# Patient Record
Sex: Female | Born: 2000 | Race: White | Hispanic: No | Marital: Single | Smoking: Current some day smoker
Health system: Southern US, Community
[De-identification: ages and names within clinical notes are randomized; demographics above are authoritative.]

## PROBLEM LIST (undated history)

## (undated) DIAGNOSIS — F419 Anxiety disorder, unspecified: Secondary | ICD-10-CM

## (undated) DIAGNOSIS — G43909 Migraine, unspecified, not intractable, without status migrainosus: Secondary | ICD-10-CM

## (undated) DIAGNOSIS — D649 Anemia, unspecified: Secondary | ICD-10-CM

---
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
## 2018-02-04 ENCOUNTER — Encounter (HOSPITAL_COMMUNITY): Payer: Self-pay

## 2018-02-04 ENCOUNTER — Other Ambulatory Visit: Payer: Self-pay

## 2018-02-04 ENCOUNTER — Ambulatory Visit (HOSPITAL_COMMUNITY)
Admission: EM | Admit: 2018-02-04 | Discharge: 2018-02-04 | Disposition: A | Discharge status: Home / Self Care | Payer: Medicaid Other | Attending: Family Medicine | Admitting: Family Medicine

## 2018-02-04 DIAGNOSIS — G44209 Tension-type headache, unspecified, not intractable: Secondary | ICD-10-CM

## 2018-02-04 DIAGNOSIS — F411 Generalized anxiety disorder: Secondary | ICD-10-CM

## 2018-02-04 DIAGNOSIS — R079 Chest pain, unspecified: Secondary | ICD-10-CM

## 2018-02-04 MED ORDER — KETOROLAC TROMETHAMINE 30 MG/ML IJ SOLN
30.0000 mg | Freq: Once | INTRAMUSCULAR | Status: AC
  Administered 2018-02-04: 30 mg via INTRAMUSCULAR

## 2018-02-04 MED ORDER — KETOROLAC TROMETHAMINE 30 MG/ML IJ SOLN
INTRAMUSCULAR | Status: AC
  Filled 2018-02-04: qty 1

## 2018-02-04 MED ORDER — HYDROXYZINE HCL 25 MG PO TABS: 25.0000 mg | ORAL_TABLET | Freq: Three times a day (TID) | ORAL | 0 refills | Status: DC | PRN

## 2018-02-04 NOTE — Discharge Instructions (Addendum)
I believe that most of your symptoms are related to stress and anxiety. We will give you a Toradol injection here in the clinic for your pain I am sending a prescription for hydroxyzine to the pharmacy to help with anxiety It looks like you were prescribed naproxen at your last visit You can take this for further chest pain and headaches Follow-up with your psychiatrist for further management

## 2018-02-04 NOTE — ED Triage Notes (Signed)
Pt cc headachesx 1 month and chest pain due to acid reflux x 3 months.

## 2018-02-06 ENCOUNTER — Other Ambulatory Visit: Payer: Self-pay

## 2018-02-06 ENCOUNTER — Emergency Department (HOSPITAL_COMMUNITY): Payer: Medicaid Other

## 2018-02-06 ENCOUNTER — Emergency Department (HOSPITAL_COMMUNITY)
Admission: EM | Admit: 2018-02-06 | Discharge: 2018-02-06 | Disposition: A | Discharge status: Home / Self Care | Payer: Medicaid Other | Attending: Emergency Medicine | Admitting: Emergency Medicine

## 2018-02-06 ENCOUNTER — Encounter (HOSPITAL_COMMUNITY): Payer: Self-pay | Admitting: Emergency Medicine

## 2018-02-06 DIAGNOSIS — R51 Headache: Secondary | ICD-10-CM | POA: Diagnosis present

## 2018-02-06 DIAGNOSIS — G43009 Migraine without aura, not intractable, without status migrainosus: Secondary | ICD-10-CM

## 2018-02-06 DIAGNOSIS — G43909 Migraine, unspecified, not intractable, without status migrainosus: Secondary | ICD-10-CM | POA: Insufficient documentation

## 2018-02-06 HISTORY — DX: Anxiety disorder, unspecified: F41.9

## 2018-02-06 HISTORY — DX: Migraine, unspecified, not intractable, without status migrainosus: G43.909

## 2018-02-06 MED ORDER — SODIUM CHLORIDE 0.9 % IV BOLUS
1000.0000 mL | Freq: Once | INTRAVENOUS | Status: AC
  Administered 2018-02-06: 1000 mL via INTRAVENOUS

## 2018-02-06 MED ORDER — PROCHLORPERAZINE EDISYLATE 10 MG/2ML IJ SOLN
10.0000 mg | Freq: Once | INTRAMUSCULAR | Status: AC
  Administered 2018-02-06: 10 mg via INTRAVENOUS
  Filled 2018-02-06: qty 2

## 2018-02-06 MED ORDER — DIPHENHYDRAMINE HCL 50 MG/ML IJ SOLN
25.0000 mg | Freq: Once | INTRAMUSCULAR | Status: AC
  Administered 2018-02-06: 25 mg via INTRAVENOUS
  Filled 2018-02-06: qty 1

## 2018-02-06 MED ORDER — PROCHLORPERAZINE MALEATE 10 MG PO TABS: 10.0000 mg | ORAL_TABLET | Freq: Two times a day (BID) | ORAL | 0 refills | Status: DC | PRN

## 2018-02-06 NOTE — ED Triage Notes (Signed)
Pt c/o headache that started tonight, also reports facial numbness and states she feels like her heart is racing. Hx migraines and anxiety.

## 2018-02-06 NOTE — ED Provider Notes (Signed)
[facility] Provider Note MRN:  [id number]  Arrival date & time: 02/06/18     Chief Complaint   Headache   History of Present Illness   Sydney BernHaleigh N Bialecki is a 17 y.o. year-old female with a history of anxiety presenting to the ED with chief complaint of headache.  Patient explains that prior to 1 month ago, she rarely has headaches.  Experiencing intermittent moderate to severe headaches.  2 hours prior to arrival, she was in the car when she experienced severe frontal head pain and pressure, which became severe and at its worst after only 10 minutes.  Associated with paresthesias to bilateral hands, denies any numbness or weakness.  Worse with bright lights.  Also endorsing intermittent chest pain for several weeks, none presently.  Review of Systems  A complete 10 system review of systems was obtained and all systems are negative except as noted in the HPI and PMH.   Patient's Health History    Past Medical History:  Diagnosis Date  . Anemia   . Anxiety   . Migraines     History reviewed. No pertinent surgical history.  No family history on file.  Social History   Socioeconomic History  . Marital status: Single    Spouse name: Not on file  . Number of children: Not on file  . Years of education: Not on file  . Highest education level: Not on file  Occupational History  . Not on file  Social Needs  . Financial resource strain: Not on file  . Food insecurity:    Worry: Not on file    Inability: Not on file  . Transportation needs:    Medical: Not on file    Non-medical: Not on file  Tobacco Use  . Smoking status: Current Some Day Smoker    Types: E-cigarettes  . Smokeless tobacco: Never Used  . Tobacco comment: vape  Substance and Sexual Activity  . Alcohol use: Yes  . Drug use: Not on file  . Sexual activity: Not on file  Lifestyle  . Physical activity:    Days per week: Not on file    Minutes per session: Not on file    . Stress: Not on file  Relationships  . Social connections:    Talks on phone: Not on file    Gets together: Not on file    Attends religious service: Not on file    Active member of club or organization: Not on file    Attends meetings of clubs or organizations: Not on file    Relationship status: Not on file  . Intimate partner violence:    Fear of current or ex partner: Not on file    Emotionally abused: Not on file    Physically abused: Not on file    Forced sexual activity: Not on file  Other Topics Concern  . Not on file  Social History Narrative  . Not on file     Physical Exam  Vital Signs and Nursing Notes reviewed Vitals:   02/06/18 2000 02/06/18 2230  BP: 115/82 101/68  Pulse: 72 91  Resp: 15 16  Temp:    SpO2: 99% 100%    CONSTITUTIONAL: Well-appearing, NAD NEURO:  Alert and oriented x 3, no focal deficits EYES:  eyes equal and reactive ENT/NECK:  no LAD, no JVD CARDIO: Regular rate, well-perfused, normal S1 and S2 PULM:  CTAB no wheezing or rhonchi GI/GU:  normal bowel sounds, non-distended, non-tender  MSK/SPINE:  No gross deformities, no edema SKIN:  no rash, atraumatic PSYCH:  Appropriate speech and behavior  Diagnostic and Interventional Summary    EKG Interpretation  Date/Time:  Saturday February 06 2018 20:52:35 EST Ventricular Rate:  81 PR Interval:    QRS Duration: 103 QT Interval:  395 QTC Calculation: 459 R Axis:   90 Text Interpretation:  Sinus rhythm Borderline right axis deviation Confirmed by Kennis CarinaBero,  [phone]) on 02/06/2018 10:04:48 PM      Labs Reviewed - No data to display  CT HEAD WO CONTRAST  Final Result      Medications  prochlorperazine (COMPAZINE) injection 10 mg (10 mg Intravenous Given 02/06/18 2105)  diphenhydrAMINE (BENADRYL) injection 25 mg (25 mg Intravenous Given 02/06/18 2105)  sodium chloride 0.9 % bolus 1,000 mL (0 mLs Intravenous Stopped 02/06/18 2214)     Procedures Critical Care  ED Course and  Medical Decision Making  I have reviewed the triage vital signs and the nursing notes.  Pertinent labs & imaging results that were available during my care of the patient were reviewed by me and considered in my medical decision making (see below for details).  No fever, no meningismus, no neurological deficits, all seems consistent with migraine headache with the exception of sudden onset nature.  Will CT head to exclude the possibility of subarachnoid hemorrhage.  Chest pain is intermittent, sharp, mild, has been worked up twice in the past, none currently present, EKG reassuring here.  CT head unremarkable, patient feeling better after migraine cocktail, advised PCP follow-up.  After the discussed management above, the patient was determined to be safe for discharge.  The patient was in agreement with this plan and all questions regarding their care were answered.  ED return precautions were discussed and the patient will return to the ED with any significant worsening of condition.  Elmer Sow M. Pilar PlateBero, MD [facility] [email or web address]  Final Clinical Impressions(s) / ED Diagnoses     ICD-10-CM   1. Migraine without aura and without status migrainosus, not intractable G43.009     ED Discharge Orders         Ordered    prochlorperazine (COMPAZINE) 10 MG tablet  2 times daily PRN     02/06/18 2240             Sabas SousBero,  M, MD 02/06/18 2249

## 2018-02-06 NOTE — Discharge Instructions (Addendum)
You were evaluated in the Emergency Department and after careful evaluation, we did not find any emergent condition requiring admission or further testing in the hospital.  Your symptoms today seem to be due to a migraine headache.  Please discuss your headaches with a primary care provider and discuss possible preventative medications.  I have provided you with a prescription for Compazine, which is helpful for migraines.  Do not use this medication if there is a chance that you are pregnant.  Please return to the Emergency Department if you experience any worsening of your condition.  We encourage you to follow up with a primary care provider.  Thank you for allowing us to be a part of your care.

## 2018-02-08 NOTE — ED Provider Notes (Signed)
[facility]    CSN: [id number] Arrival date & time: 02/04/18  1843     History   Chief Complaint Chief Complaint  Patient presents with  . Headache    HPI Sydney Chan is a 17 y.o. female.   Pt is a 17 year old female with PMH of anemia, anxiety, migraines that presents with headaches and chest pain. The headaches have been intermittent over the past month.  She describes them as dull ache in the temple areas mostly on the left side.  She has some mild photophobia with this but denies any visual changes, dizziness, nausea, vomiting.  She is not currently taking any prescribed medication to treat the migraines.  Typically she uses over-the-counter medication which somewhat helps.  She admits to being under a lot of stress and having some anxiety recently.  She is currently seeing a psychiatrist but has not been prescribed any medication for anxiety. She is also here for intermittent chest pain that is centralized.  This pain waxes and wanes but is worse with certain movements and deep breathing.  She denies any associated shortness of breath but does admit to using vape.  Denies any cough, congestion, fever, body aches, night sweats.  She was seen here on 11/17/2017 and treated with naproxen for possibility of musculoskeletal pain and omeprazole for GERD.  She admits to being scared to take any medication that was prescribed and having a lot of anxiety about it.  ROS per HPI    Headache    Past Medical History:  Diagnosis Date  . Anemia   . Anxiety   . Migraines     There are no active problems to display for this patient.   History reviewed. No pertinent surgical history.  OB History   No obstetric history on file.      Home Medications    Prior to Admission medications   Medication Sig Start Date End Date Taking? Authorizing Provider  Acetaminophen-Caff-Pyrilamine (MIDOL COMPLETE PO) Take 2 tablets by mouth daily as needed (menstrual pain).     [provider]  hydrOXYzine (ATARAX/VISTARIL) 25 MG tablet Take 1 tablet (25 mg total) by mouth every 8 (eight) hours as needed. Patient taking differently: Take 25 mg by mouth every 8 (eight) hours as needed for anxiety.  02/04/18   Dahlia ByesBast, Sarha Bartelt A, NP  ibuprofen (ADVIL,MOTRIN) 200 MG tablet Take 400 mg by mouth daily as needed for headache (pain).    [provider]  naproxen (NAPROSYN) 375 MG tablet Take 1 tablet (375 mg total) by mouth 2 (two) times daily. Patient not taking: Reported on 02/06/2018 11/17/17   Wieters, Hallie C, PA-C  omeprazole (PRILOSEC) 20 MG capsule Take 1 capsule (20 mg total) by mouth daily for 14 days. Patient not taking: Reported on 02/06/2018 11/17/17 12/01/17  Wieters, Hallie C, PA-C  prochlorperazine (COMPAZINE) 10 MG tablet Take 1 tablet (10 mg total) by mouth 2 (two) times daily as needed for nausea. 02/06/18   Sabas SousBero, Michael M, MD    Family History History reviewed. No pertinent family history.  Social History Social History   Tobacco Use  . Smoking status: Current Some Day Smoker    Types: E-cigarettes  . Smokeless tobacco: Never Used  . Tobacco comment: vape  Substance Use Topics  . Alcohol use: Yes  . Drug use: Not on file     Allergies   Patient has no known allergies.   Review of Systems Review of Systems  Neurological: Positive  for headaches.     Physical Exam Triage Vital Signs ED Triage Vitals  Enc Vitals Group     BP 02/04/18 1929 (!) 119/98     Pulse Rate 02/04/18 1929 99     Resp 02/04/18 1929 18     Temp 02/04/18 1929 98.2 F (36.8 C)     Temp Source 02/04/18 1929 Oral     SpO2 02/04/18 1929 100 %     Weight 02/04/18 1932 135 lb (61.2 kg)     Height --      Head Circumference --      Peak Flow --      Pain Score 02/04/18 1931 6     Pain Loc --      Pain Edu? --      Excl. in GC? --    No data found.  Updated Vital Signs BP (!) 119/98 (BP Location: Right Arm)   Pulse 99   Temp 98.2 F (36.8 C)  (Oral)   Resp 18   Wt 135 lb (61.2 kg)   LMP 01/14/2018   SpO2 100%   BMI 20.53 kg/m   Visual Acuity Right Eye Distance:   Left Eye Distance:   Bilateral Distance:    Right Eye Near:   Left Eye Near:    Bilateral Near:     Physical Exam Vitals signs and nursing note reviewed.  Constitutional:      General: She is not in acute distress.    Appearance: She is well-developed. She is not ill-appearing or toxic-appearing.  HENT:     Head: Normocephalic and atraumatic.     Mouth/Throat:     Mouth: Mucous membranes are moist.     Pharynx: Oropharynx is clear.  Eyes:     General: No visual field deficit.    Extraocular Movements: Extraocular movements intact.     Right eye: No nystagmus.     Left eye: No nystagmus.     Pupils: Pupils are equal, round, and reactive to light.  Neck:     Musculoskeletal: Normal range of motion.  Cardiovascular:     Rate and Rhythm: Normal rate and regular rhythm.     Heart sounds: Normal heart sounds.  Pulmonary:     Effort: Pulmonary effort is normal.     Breath sounds: Normal breath sounds.  Abdominal:     General: Bowel sounds are normal. There is no distension.     Palpations: Abdomen is soft. There is no mass.     Tenderness: There is no abdominal tenderness. There is no guarding.  Musculoskeletal: Normal range of motion.  Skin:    General: Skin is warm and dry.     Findings: Rash present.  Neurological:     Mental Status: She is alert and oriented to person, place, and time.     GCS: GCS eye subscore is 4. GCS verbal subscore is 5. GCS motor subscore is 6.     Cranial Nerves: No cranial nerve deficit, dysarthria or facial asymmetry.     Sensory: No sensory deficit.     Motor: No weakness.     Coordination: Romberg sign negative. Coordination normal.     Gait: Gait normal.     Comments: No focal neuro deficits   Psychiatric:        Mood and Affect: Mood is anxious.      UC Treatments / Results  Labs (all labs ordered are  listed, but only abnormal results are displayed) Labs Reviewed - No  data to display  EKG None  Radiology Ct Head Wo Contrast  Result Date: 02/06/2018 CLINICAL DATA:  Headache. EXAM: CT HEAD WITHOUT CONTRAST TECHNIQUE: Contiguous axial images were obtained from the base of the skull through the vertex without intravenous contrast. COMPARISON:  No comparison studies available. FINDINGS: Brain: There is no evidence for acute hemorrhage, hydrocephalus, mass lesion, or abnormal extra-axial fluid collection. No definite CT evidence for acute infarction. Vascular: No hyperdense vessel or unexpected calcification. Skull: No evidence for fracture. No worrisome lytic or sclerotic lesion. Sinuses/Orbits: The visualized paranasal sinuses and mastoid air cells are clear. Visualized portions of the globes and intraorbital fat are unremarkable. Other: None. IMPRESSION: Normal CT evaluation of the brain. Electronically Signed   By: Kennith Center M.D.   On: 02/06/2018 21:11    Procedures Procedures (including critical care time)  Medications Ordered in UC Medications  ketorolac (TORADOL) 30 MG/ML injection 30 mg (30 mg Intramuscular Given 02/04/18 2002)    Initial Impression / Assessment and Plan / UC Course  I have reviewed the triage vital signs and the nursing notes.  Pertinent labs & imaging results that were available during my care of the patient were reviewed by me and considered in my medical decision making (see chart for details).     Patient is a 17 year old female that presents today with chronic headaches and chest discomfort She was just seen for the chest discomfort on 11/17/2017 and was found to have negative chest x-ray and negative EKG. she was then prescribed omeprazole and given a GI cocktail in clinic.  She was also prescribed naproxen for possibility of musculoskeletal chest pain She returns today still having intermittent chest pains and headache that appears to be stress and  tension related I believe most of her chest pain is likely due to anxiety which brings on her headaches Toradol injection given here in the clinic for headache.  This will also cover if she is having any musculoskeletal chest pain. Will send home with prescription for hydroxyzine for anxiety and have her take this as needed Instructed to follow back up with her psychiatrist for daily management of her anxiety I believe that once her anxiety is controlled that her symptoms should somewhat resolve Instructed to discontinue using the vape due to its harmful effects She can continue the naproxen as needed for pain and the omeprazole for acid reflux Patient understanding and agreeable to plan  Final Clinical Impressions(s) / UC Diagnoses   Final diagnoses:  Acute non intractable tension-type headache  Chest pain, unspecified type  Generalized anxiety disorder     Discharge Instructions     I believe that most of your symptoms are related to stress and anxiety. We will give you a Toradol injection here in the clinic for your pain I am sending a prescription for hydroxyzine to the pharmacy to help with anxiety It looks like you were prescribed naproxen at your last visit You can take this for further chest pain and headaches Follow-up with your psychiatrist for further management     ED Prescriptions    Medication Sig Dispense Auth. Provider   hydrOXYzine (ATARAX/VISTARIL) 25 MG tablet Take 1 tablet (25 mg total) by mouth every 8 (eight) hours as needed. Patient taking differently:  Take 25 mg by mouth every 8 (eight) hours as needed for anxiety.  21 tablet Janace Aris, NP     Controlled Substance Prescriptions [place] Controlled Substance Registry consulted? no   Janace Aris, NP 02/08/18 [phone]

## 2018-03-07 ENCOUNTER — Emergency Department (HOSPITAL_COMMUNITY)
Admission: EM | Admit: 2018-03-07 | Discharge: 2018-03-07 | Disposition: A | Discharge status: Home / Self Care | Payer: Medicaid Other | Attending: Emergency Medicine | Admitting: Emergency Medicine

## 2018-03-07 ENCOUNTER — Other Ambulatory Visit: Payer: Self-pay

## 2018-03-07 ENCOUNTER — Encounter (HOSPITAL_COMMUNITY): Payer: Self-pay | Admitting: Emergency Medicine

## 2018-03-07 DIAGNOSIS — G43109 Migraine with aura, not intractable, without status migrainosus: Secondary | ICD-10-CM | POA: Diagnosis not present

## 2018-03-07 DIAGNOSIS — H5711 Ocular pain, right eye: Secondary | ICD-10-CM | POA: Diagnosis present

## 2018-03-07 DIAGNOSIS — F17290 Nicotine dependence, other tobacco product, uncomplicated: Secondary | ICD-10-CM | POA: Diagnosis not present

## 2018-03-07 MED ORDER — METOCLOPRAMIDE HCL 5 MG/ML IJ SOLN
10.0000 mg | INTRAMUSCULAR | Status: DC
  Filled 2018-03-07: qty 2

## 2018-03-07 MED ORDER — TETRACAINE HCL 0.5 % OP SOLN
1.0000 [drp] | Freq: Once | OPHTHALMIC | Status: AC
  Administered 2018-03-07: 1 [drp] via OPHTHALMIC
  Filled 2018-03-07: qty 4

## 2018-03-07 MED ORDER — SODIUM CHLORIDE 0.9 % IV BOLUS: 500.0000 mL | Freq: Once | INTRAVENOUS | Status: DC

## 2018-03-07 NOTE — ED Notes (Signed)
Reviewed d/c instructions with pt, who verbalized understanding and had no outstanding questions. Pt departed in NAD, refused use of wheelchair.   

## 2018-03-07 NOTE — Discharge Instructions (Signed)
We recommend that you continue Fioricet as previously prescribed by your primary care doctor.  Follow-up with your primary doctor regarding your visit to the ED today.  You may benefit from future follow-up with a neurologist given your frequent headaches.  You may return for new or concerning symptoms.

## 2018-03-07 NOTE — ED Triage Notes (Addendum)
Pt reports daily migraines for the past 2 weeks. Today, reports new onset of aching pain to R eye, with reported "cloudy" vision change to same.

## 2018-03-07 NOTE — ED Provider Notes (Signed)
[facility] Provider Note   CSN: [id number] Arrival date & time: 03/07/18  0204     History   Chief Complaint Chief Complaint  Patient presents with  . Migraine  . Eye Pain    HPI Sydney Chan is a 18 y.o. female.  18 year old female with a history of migraines presents to the emergency department for evaluation of periorbital pain on the right.  Patient states that she has been having daily migraines for the past 2 weeks.  Did see a primary care doctor recently who started her on Fioricet to take as needed.  She has not tried this medication yet.  Been noticing fairly consistent pain around her right eye today, unchanged.  Describes the pain as aching.  She has had some blurry vision in the right eye, but states that this resolves when she wears her glasses.  She has not had any facial injury or head trauma.  No complete vision loss, periorbital redness or swelling, fevers, vomiting, extremity numbness or paresthesias, extremity weakness.  The history is provided by the patient. No language interpreter was used.  Migraine   Eye Pain     Past Medical History:  Diagnosis Date  . Anemia   . Anxiety   . Migraines     There are no active problems to display for this patient.   History reviewed. No pertinent surgical history.   OB History   No obstetric history on file.      Home Medications    Prior to Admission medications   Medication Sig Start Date End Date Taking? Authorizing Provider  Acetaminophen-Caff-Pyrilamine (MIDOL COMPLETE PO) Take 2 tablets by mouth daily as needed (menstrual pain).    [provider]  hydrOXYzine (ATARAX/VISTARIL) 25 MG tablet Take 1 tablet (25 mg total) by mouth every 8 (eight) hours as needed. Patient taking differently: Take 25 mg by mouth every 8 (eight) hours as needed for anxiety.  02/04/18   Dahlia ByesBast, Traci A, NP  ibuprofen (ADVIL,MOTRIN) 200 MG tablet Take 400 mg by mouth daily as  needed for headache (pain).    [provider]  naproxen (NAPROSYN) 375 MG tablet Take 1 tablet (375 mg total) by mouth 2 (two) times daily. Patient not taking: Reported on 02/06/2018 11/17/17   Wieters, Hallie C, PA-C  omeprazole (PRILOSEC) 20 MG capsule Take 1 capsule (20 mg total) by mouth daily for 14 days. Patient not taking: Reported on 02/06/2018 11/17/17 12/01/17  Wieters, Hallie C, PA-C  prochlorperazine (COMPAZINE) 10 MG tablet Take 1 tablet (10 mg total) by mouth 2 (two) times daily as needed for nausea. 02/06/18   Sabas SousBero, Michael M, MD    Family History History reviewed. No pertinent family history.  Social History Social History   Tobacco Use  . Smoking status: Current Some Day Smoker    Types: E-cigarettes  . Smokeless tobacco: Never Used  . Tobacco comment: vape  Substance Use Topics  . Alcohol use: Yes  . Drug use: Not on file     Allergies   Compazine [prochlorperazine edisylate]   Review of Systems Review of Systems  Eyes: Positive for pain.  Ten systems reviewed and are negative for acute change, except as noted in the HPI.    Physical Exam Updated Vital Signs BP 108/80   Pulse 81   Temp 97.8 F (36.6 C) (Oral)   Resp 14   Ht 5\' 8"  (1.727 m)   Wt 58.5 kg   LMP 03/06/2018 (  Exact Date)   SpO2 100%   BMI 19.61 kg/m   Physical Exam Vitals signs and nursing note reviewed.  Constitutional:      General: She is not in acute distress.    Appearance: She is well-developed. She is not diaphoretic.     Comments: Nontoxic appearing and in NAD  HENT:     Head: Normocephalic and atraumatic.     Right Ear: Tympanic membrane, ear canal and external ear normal.     Left Ear: Tympanic membrane, ear canal and external ear normal.     Mouth/Throat:     Mouth: Mucous membranes are moist.     Comments: Symmetric rise of the uvula with phonation Eyes:     General: No scleral icterus.    Conjunctiva/sclera: Conjunctivae normal.     Comments: No  nystagmus noted. Mild anisocoria, R > L. No temporal aa tenderness. No periorbital edema or erythema.  Neck:     Musculoskeletal: Normal range of motion.  Cardiovascular:     Rate and Rhythm: Normal rate and regular rhythm.     Pulses: Normal pulses.  Pulmonary:     Effort: Pulmonary effort is normal. No respiratory distress.     Comments: Respirations even and unlabored Musculoskeletal: Normal range of motion.  Skin:    General: Skin is warm and dry.     Coloration: Skin is not pale.     Findings: No erythema or rash.  Neurological:     General: No focal deficit present.     Mental Status: She is alert and oriented to person, place, and time.     Coordination: Coordination normal.     Comments: GCS 15. Speech is goal oriented. No cranial nerve deficits appreciated; symmetric eyebrow raise, no facial drooping, tongue midline. Patient has equal grip strength bilaterally with 5/5 strength against resistance in all major muscle groups bilaterally. Sensation to light touch intact. Patient moves extremities without ataxia.   Psychiatric:        Behavior: Behavior normal.      ED Treatments / Results  Labs (all labs ordered are listed, but only abnormal results are displayed) Labs Reviewed - No data to display  EKG None  Radiology No results found.  Procedures Procedures (including critical care time)  Medications Ordered in ED Medications  sodium chloride 0.9 % bolus 500 mL (has no administration in time range)  metoCLOPramide (REGLAN) injection 10 mg (has no administration in time range)  tetracaine (PONTOCAINE) 0.5 % ophthalmic solution 1-2 drop (1 drop Right Eye Given 03/07/18 0236)    3:38 AM Patient feels much improved following Reglan.  She reports resolution of her ocular pain.  Her intraocular pressure on the right is 19 with a 95% confidence interval.  She expresses comfort with discharge and continued outpatient management.  Will refer to neurology given history of  frequent headaches.   Initial Impression / Assessment and Plan / ED Course  I have reviewed the triage vital signs and the nursing notes.  Pertinent labs & imaging results that were available during my care of the patient were reviewed by me and considered in my medical decision making (see chart for details).     Patient presents to the emergency department for evaluation of headache which began today.  Pain centered around her right eye.  Patient with no history of recent head injury or trauma.  No fever, nuchal rigidity, meningismus to suggest meningitis.  Neurologic exam today is nonfocal.  Her IOP on the right  is normal.  On reassessment, the patient has had significant improvement in headache symptoms following IVF and Reglan.  I do not believe further emergent workup is indicated at this time.  Return precautions discussed and provided.  Patient discharged in stable condition with no unaddressed concerns.   Final Clinical Impressions(s) / ED Diagnoses   Final diagnoses:  Ocular migraine    ED Discharge Orders    None       Antony Madura, PA-C 03/07/18 0343    Palumbo, April, MD 03/07/18 [phone]

## 2018-03-07 NOTE — ED Notes (Signed)
ED Provider at bedside. 

## 2019-08-17 IMAGING — DX DG CHEST 2V
2 series · 2 of 2 positions shown · non-contrast
Comparison: None.

CLINICAL DATA: Chest pain x4 days

EXAM:
CHEST - 2 VIEW

[chest pa]
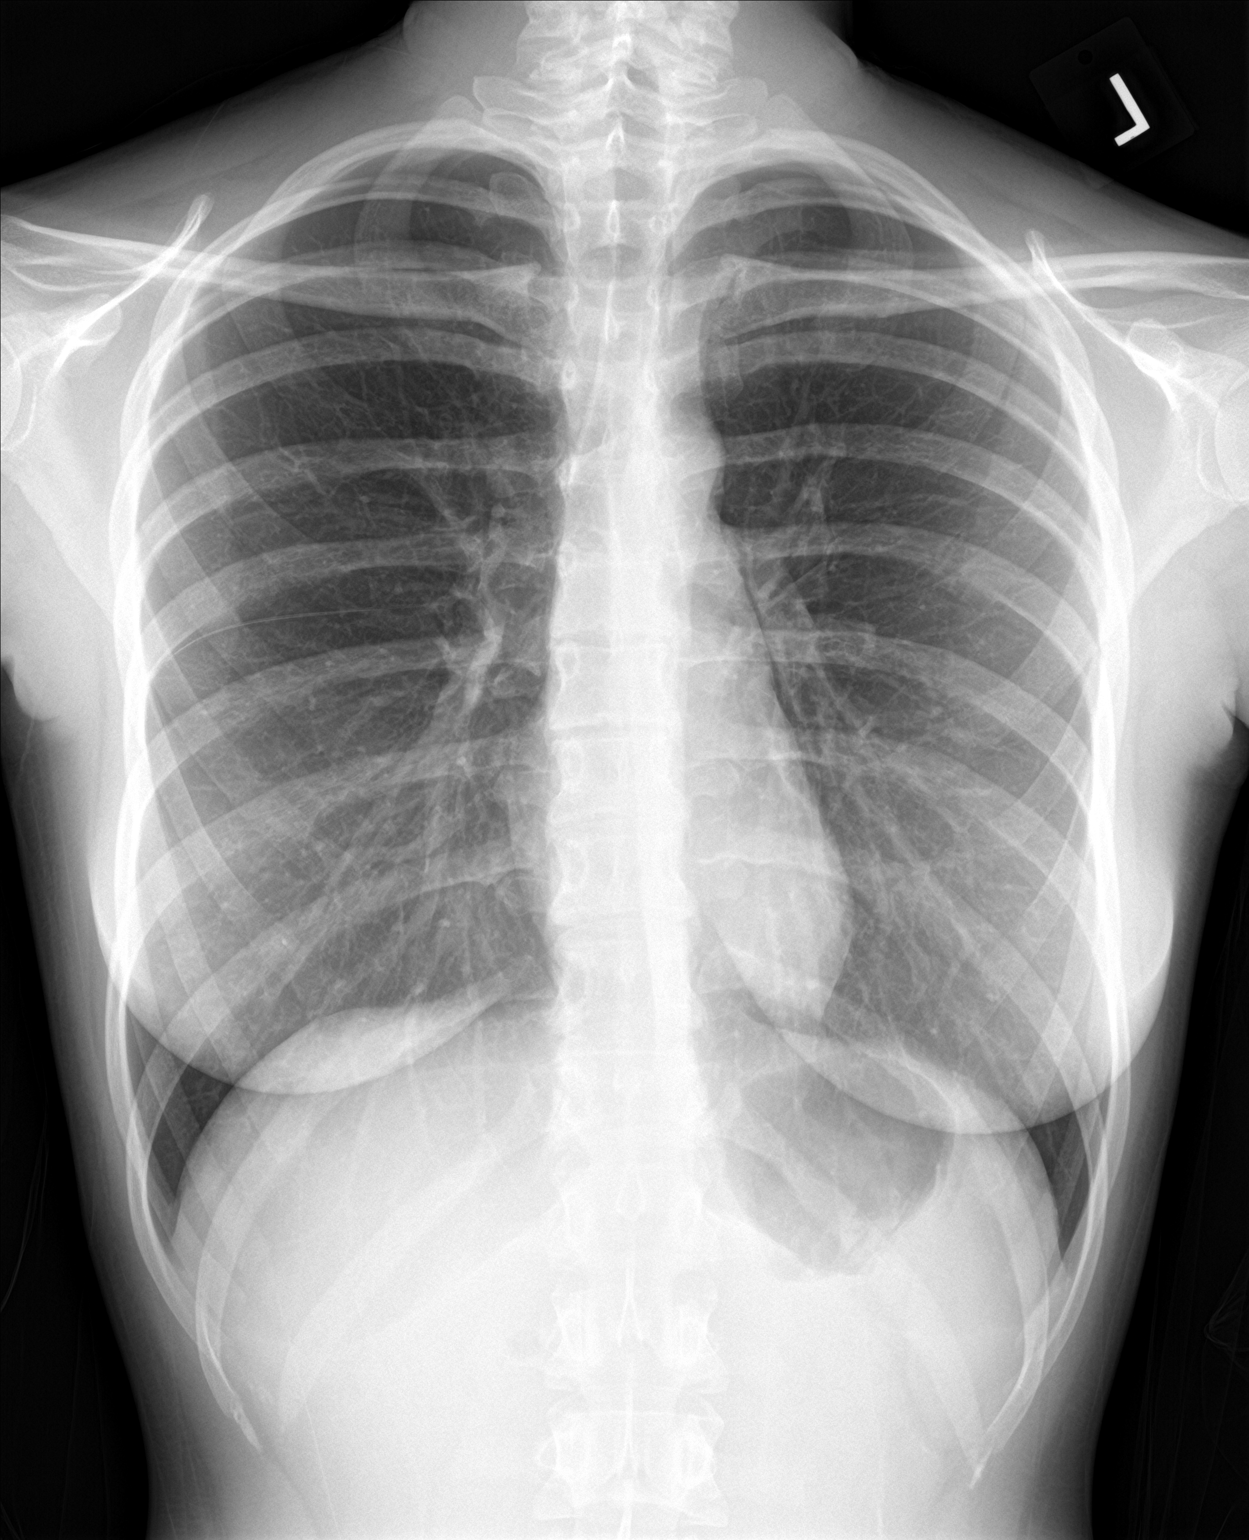

[chest lat]
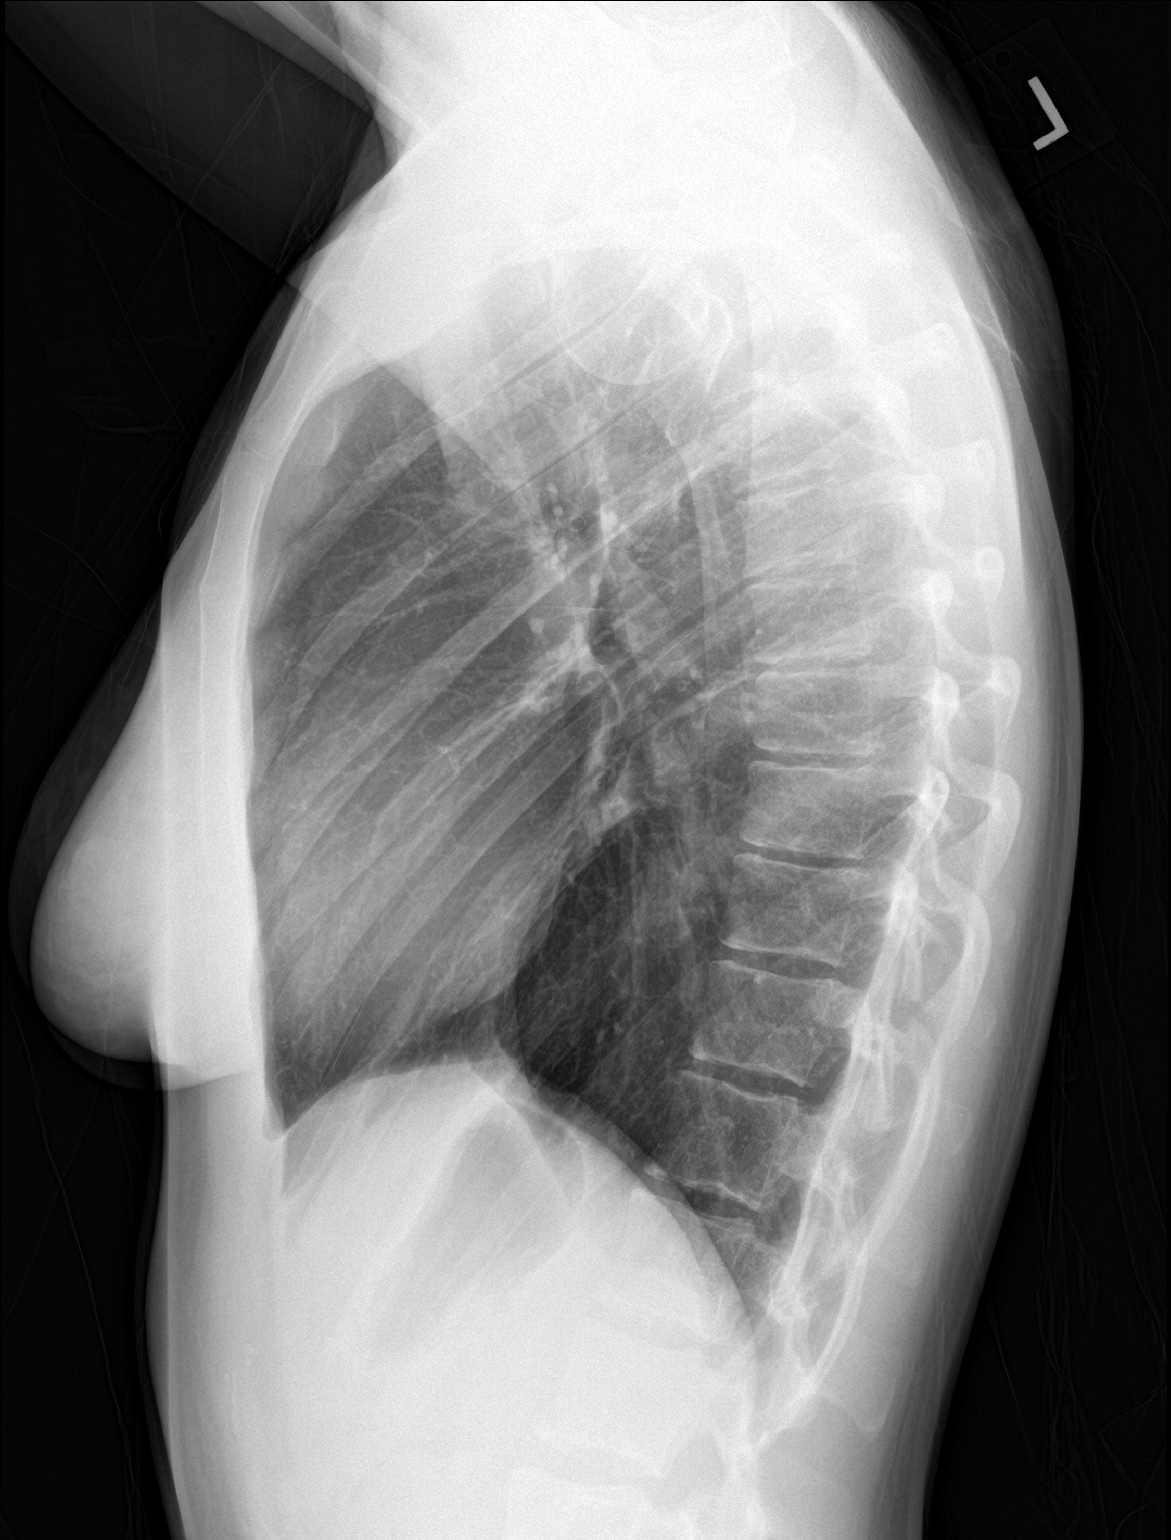

[2 of 2 positions shown; findings below may reference images not displayed]

FINDINGS: Lungs are clear.  No pleural effusion or pneumothorax.

The heart is normal in size.

Visualized osseous structures are within normal limits.
IMPRESSION: Normal chest radiographs.

## 2019-11-06 IMAGING — CT CT HEAD W/O CM
4 series · 16 of 47 positions shown, 18 images · non-contrast
Comparison: No comparison studies available.

CLINICAL DATA: Headache.

EXAM:
CT HEAD WITHOUT CONTRAST
TECHNIQUE: Contiguous axial images were obtained from the base of the skull
through the vertex without intravenous contrast.

[Series 3: head without · axial · non-contrast · 0.42mm/px · z∈[-136,-21]mm · 7 of 31 slices shown, 9 images]
[im 4/31  brain]
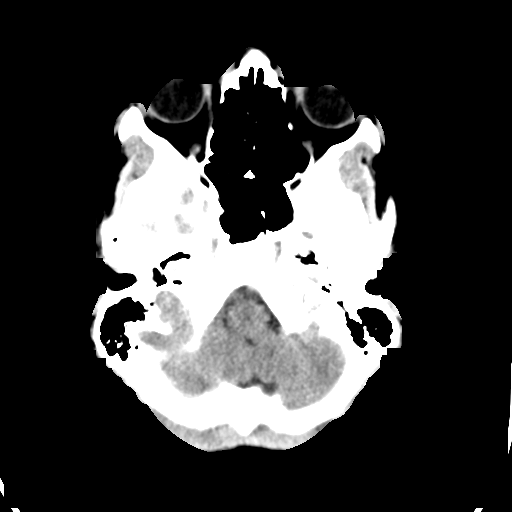
[im 4/31  bone]
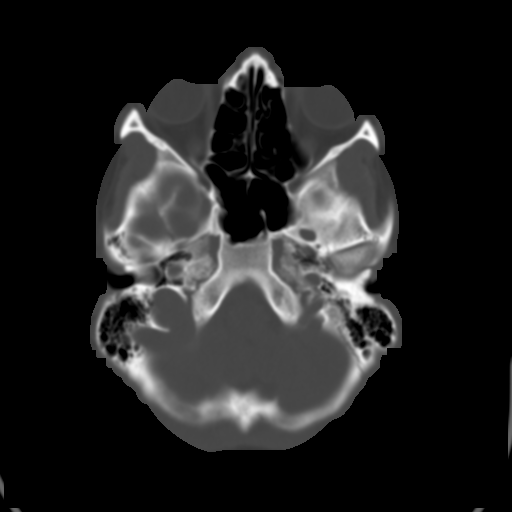
[im 8/31  brain]
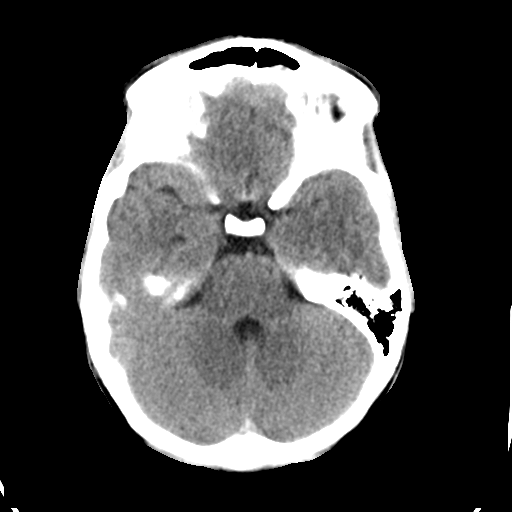
[im 12/31  brain]
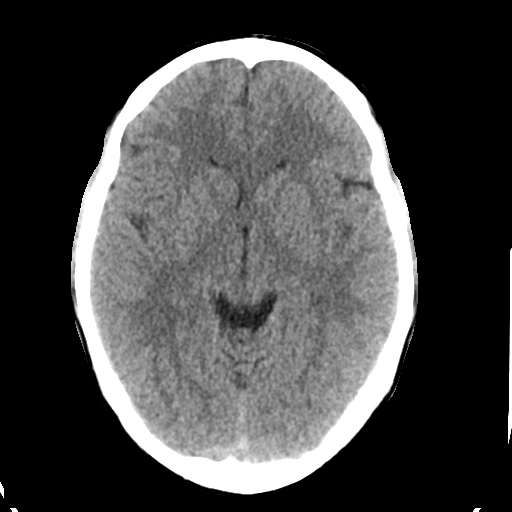
[im 16/31  brain]
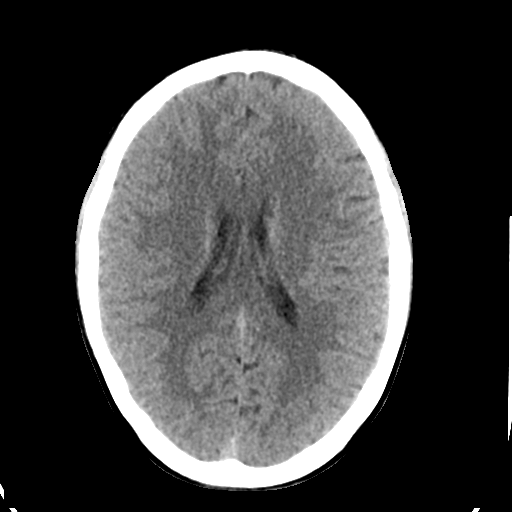
[im 19/31  brain]
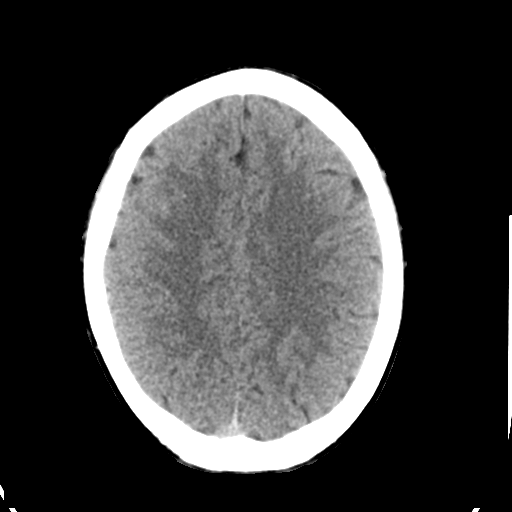
[im 19/31  bone]
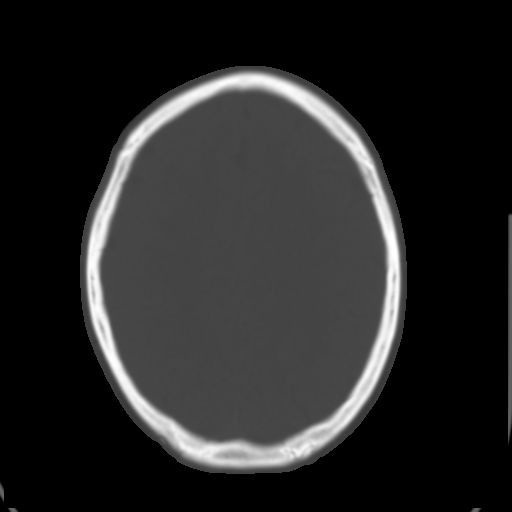
[im 23/31  brain]
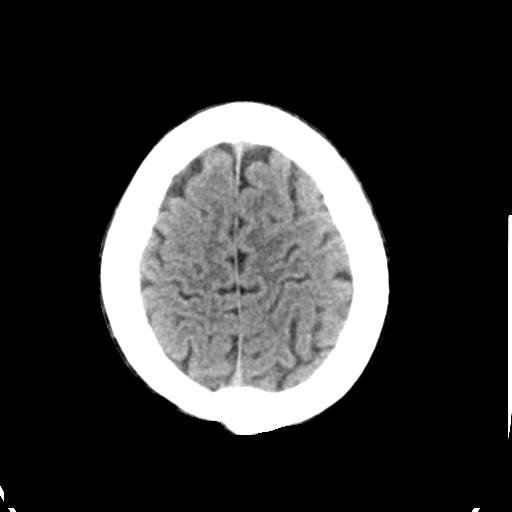
[im 27/31  brain]
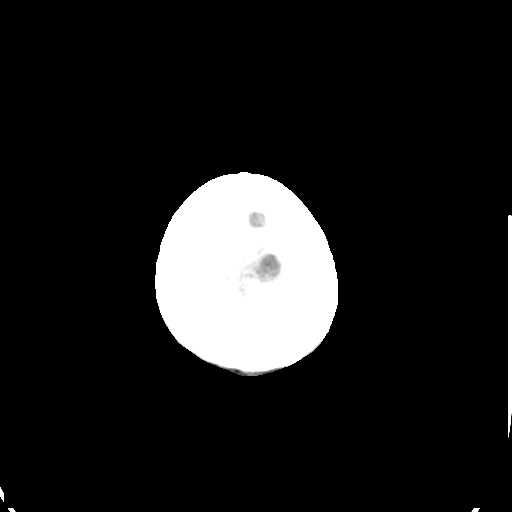

[Series 4: head bone · axial · 0.42mm/px · z∈[-137,-107]mm · 3 of 77 slices shown]
[im 8/77  bone]
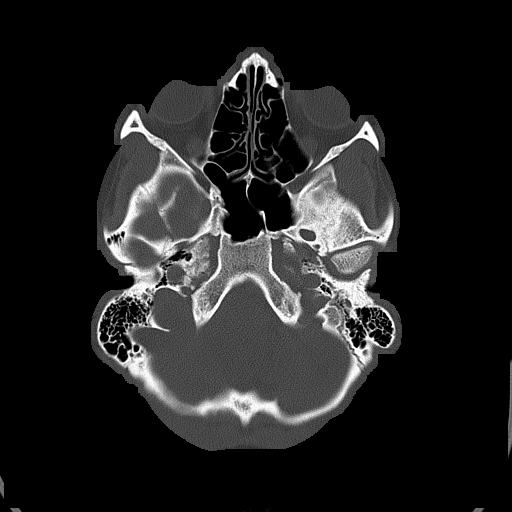
[im 16/77  bone]
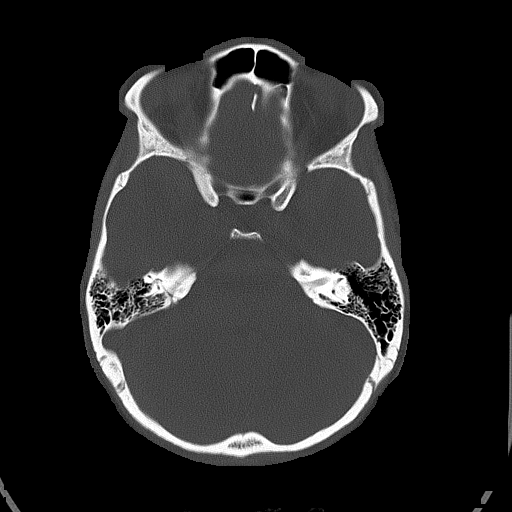
[im 23/77  bone]
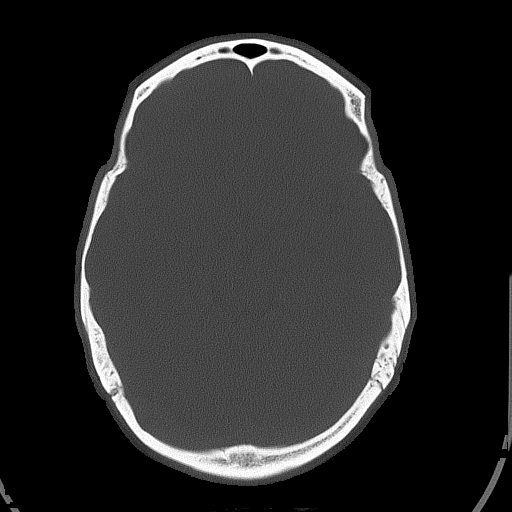

[Series 5: head without cor · coronal · non-contrast · 0.30mm/px · 3 of 67 slices shown]
[im 23/67  brain]
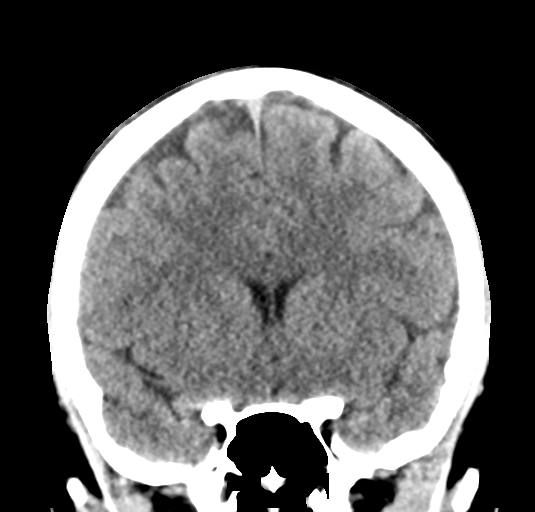
[im 30/67  brain]
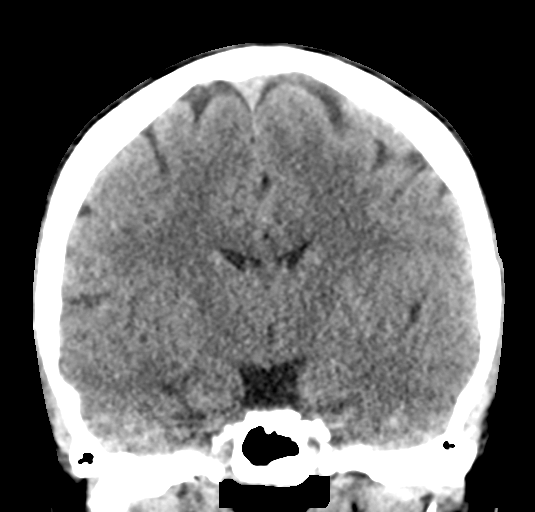
[im 37/67  brain]
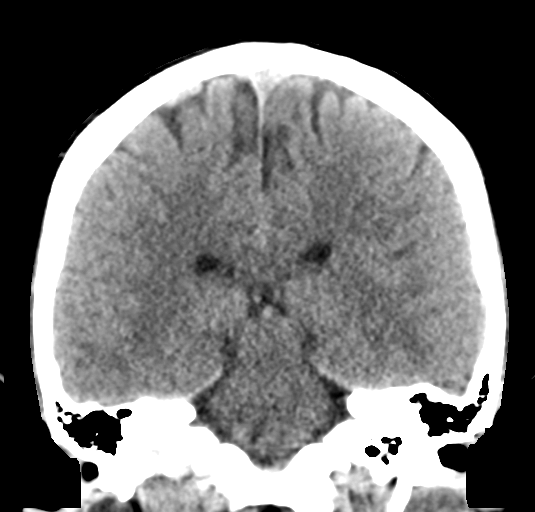

[Series 6: head without sag · sagittal · non-contrast · 0.30mm/px · 3 of 52 slices shown]
[im 18/52  brain]
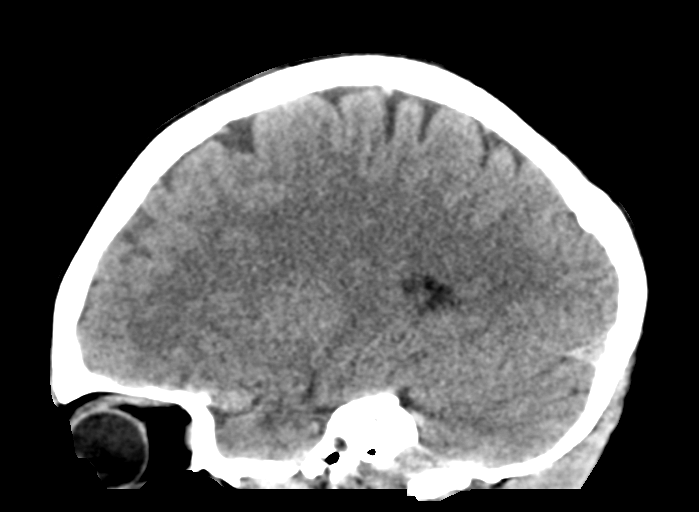
[im 26/52  brain]
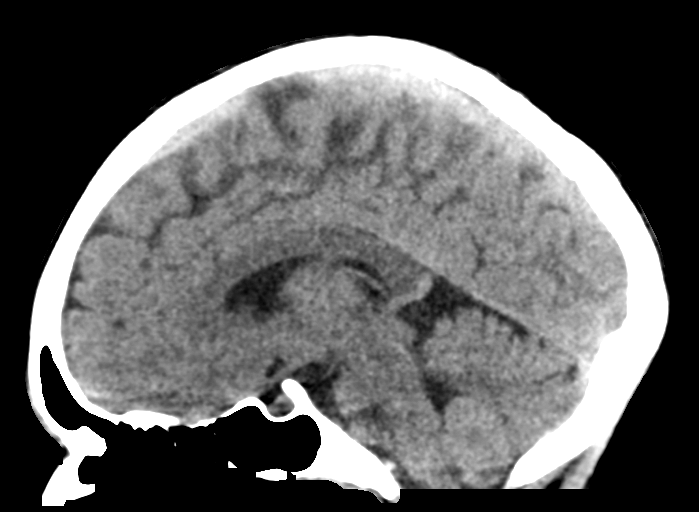
[im 35/52  brain]
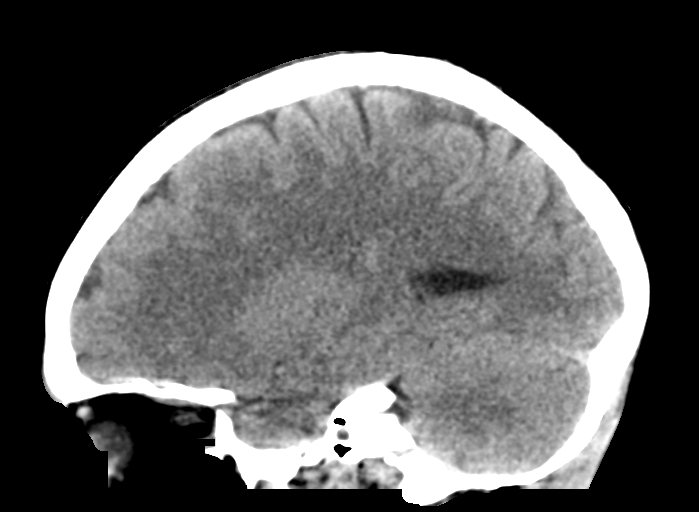

[16 of 47 positions shown; findings below may reference images not displayed]

FINDINGS: Brain: There is no evidence for acute hemorrhage, hydrocephalus,
mass lesion, or abnormal extra-axial fluid collection. No definite
CT evidence for acute infarction.

Vascular: No hyperdense vessel or unexpected calcification.

Skull: No evidence for fracture. No worrisome lytic or sclerotic
lesion.

Sinuses/Orbits: The visualized paranasal sinuses and mastoid air
cells are clear. Visualized portions of the globes and intraorbital
fat are unremarkable.

Other: None.
IMPRESSION: Normal CT evaluation of the brain.

## 2023-02-17 ENCOUNTER — Ambulatory Visit
Admission: EM | Admit: 2023-02-17 | Discharge: 2023-02-17 | Disposition: A | Discharge status: Home / Self Care | Payer: Medicaid Other | Attending: Family Medicine | Admitting: Family Medicine

## 2023-02-17 DIAGNOSIS — J029 Acute pharyngitis, unspecified: Secondary | ICD-10-CM | POA: Insufficient documentation

## 2023-02-17 LAB — POCT RAPID STREP A (OFFICE): Rapid Strep A Screen: NEGATIVE

## 2023-02-17 NOTE — ED Provider Notes (Signed)
 Sydney Chan    CSN: [id number] Arrival date & time: 02/17/23  1905      History   Chief Complaint Chief Complaint  Patient presents with   Sore Throat   Dysphagia    HPI Sydney Chan is a 22 y.o. female.   Pleasant 22 year old.  Usually in good health.  Is here for evaluation of sore throat.  Had recurring sore throats when younger and has had a tonsillectomy.  Currently has a moderately sore throat, but a feeling of difficulty swallowing.  Her throat feels somewhat swollen.  Feels like there is a lump in her throat.  She is able to swallow fluids and foods.  No fever or chills.  No headache or bodyaches.    Past Medical History:  Diagnosis Date   Anemia    Anxiety    Migraines     There are no active problems to display for this patient.   History reviewed. No pertinent surgical history.  OB History   No obstetric history on file.      Home Medications    Prior to Admission medications   Medication Sig Start Date End Date Taking? Authorizing Provider  Acetaminophen-Caff-Pyrilamine (MIDOL COMPLETE PO) Take 2 tablets by mouth daily as needed (menstrual pain).    [provider]  ibuprofen (ADVIL,MOTRIN) 200 MG tablet Take 400 mg by mouth daily as needed for headache (pain).    [provider]    Family History History reviewed. No pertinent family history.  Social History Social History   Tobacco Use   Smoking status: Some Days    Types: E-cigarettes   Smokeless tobacco: Never   Tobacco comments:    vape  Vaping Use   Vaping status: Every Day   Substances: Nicotine  Substance Use Topics   Alcohol use: Yes     Allergies   Compazine  [prochlorperazine  edisylate]   Review of Systems Review of Systems See HPI  Physical Exam Triage Vital Signs ED Triage Vitals  Encounter Vitals Group     BP 02/17/23 1914 118/77     Systolic BP Percentile --      Diastolic BP Percentile --      Pulse Rate 02/17/23 1914 65      Resp 02/17/23 1914 16     Temp 02/17/23 1914 98.1 F (36.7 C)     Temp src --      SpO2 02/17/23 1914 98 %     Weight --      Height --      Head Circumference --      Peak Flow --      Pain Score 02/17/23 1913 3     Pain Loc --      Pain Education --      Exclude from Growth Chart --    No data found.  Updated Vital Signs BP 118/77   Pulse 65   Temp 98.1 F (36.7 C)   Resp 16   LMP 02/17/2023   SpO2 98%       Physical Exam Constitutional:      General: She is not in acute distress.    Appearance: She is well-developed.  HENT:     Head: Normocephalic and atraumatic.     Right Ear: Tympanic membrane normal.     Left Ear: Tympanic membrane normal.     Mouth/Throat:     Mouth: Mucous membranes are moist.     Pharynx: Uvula midline. No pharyngeal swelling or posterior  oropharyngeal erythema.     Tonsils: No tonsillar exudate. 0 on the right. 0 on the left.     Comments: Mild erythema posterior pharynx.  No swelling noted.  No exudate. Eyes:     Conjunctiva/sclera: Conjunctivae normal.     Pupils: Pupils are equal, round, and reactive to light.  Cardiovascular:     Rate and Rhythm: Normal rate.  Pulmonary:     Effort: Pulmonary effort is normal. No respiratory distress.  Abdominal:     General: There is no distension.     Palpations: Abdomen is soft.  Musculoskeletal:        General: Normal range of motion.     Cervical back: Normal range of motion.  Lymphadenopathy:     Cervical: Cervical adenopathy present.  Skin:    General: Skin is warm and dry.  Neurological:     Mental Status: She is alert.      UC Treatments / Results  Labs (all labs ordered are listed, but only abnormal results are displayed) Labs Reviewed  POCT RAPID STREP A (OFFICE) - Normal    EKG   Radiology No results found.  Procedures Procedures (including critical Chan time)  Medications Ordered in UC Medications - No data to display  Initial Impression / Assessment and  Plan / UC Course  I have reviewed the triage vital signs and the nursing notes.  Pertinent labs & imaging results that were available during my Chan of the patient were reviewed by me and considered in my medical decision making (see chart for details).      Final Clinical Impressions(s) / UC Diagnoses   Final diagnoses:  Viral pharyngitis     Discharge Instructions      Take Tylenol or ibuprofen for pain and fever Make sure you drink plenty of liquids      ED Prescriptions   None    PDMP not reviewed this encounter.   Maranda Jamee Jacob, MD 02/17/23 [phone]

## 2023-02-17 NOTE — ED Triage Notes (Signed)
Pt presents to [facility] with co of sore throat since Friday. Pt reports Monday she started to have issues swallowing with a feeling like it was stuck in her throat. Pt reports taking Midol and motrin for symptoms.

## 2023-02-17 NOTE — Discharge Instructions (Addendum)
Take Tylenol or ibuprofen for pain and fever Make sure you drink plenty of liquids

## 2023-02-21 LAB — CULTURE, GROUP A STREP (THRC)

## 2023-09-28 ENCOUNTER — Encounter (HOSPITAL_BASED_OUTPATIENT_CLINIC_OR_DEPARTMENT_OTHER): Payer: Self-pay | Admitting: Internal Medicine

## 2023-09-28 DIAGNOSIS — G4710 Hypersomnia, unspecified: Secondary | ICD-10-CM

## 2023-09-28 DIAGNOSIS — R5383 Other fatigue: Secondary | ICD-10-CM
